# Patient Record
Sex: Female | Born: 1975
Health system: Southern US, Community
[De-identification: ages and names within clinical notes are randomized; demographics above are authoritative.]

## PROBLEM LIST (undated history)

## (undated) DIAGNOSIS — E7800 Pure hypercholesterolemia, unspecified: Secondary | ICD-10-CM

## (undated) DIAGNOSIS — M5430 Sciatica, unspecified side: Secondary | ICD-10-CM

## (undated) DIAGNOSIS — I10 Essential (primary) hypertension: Secondary | ICD-10-CM

## (undated) HISTORY — PX: HAND SURGERY: SHX662

## (undated) HISTORY — PX: BACK SURGERY: SHX140

## (undated) HISTORY — PX: CERVICAL FUSION: SHX112

---

## 2016-06-20 ENCOUNTER — Other Ambulatory Visit: Payer: Self-pay | Admitting: Internal Medicine

## 2016-06-20 DIAGNOSIS — M5136 Other intervertebral disc degeneration, lumbar region: Secondary | ICD-10-CM

## 2016-06-20 DIAGNOSIS — M5126 Other intervertebral disc displacement, lumbar region: Secondary | ICD-10-CM

## 2016-07-01 ENCOUNTER — Inpatient Hospital Stay: Admission: RE | Admit: 2016-07-01 | Payer: Self-pay | Source: Ambulatory Visit

## 2016-07-12 ENCOUNTER — Ambulatory Visit
Admission: RE | Admit: 2016-07-12 | Discharge: 2016-07-12 | Disposition: A | Discharge status: Home / Self Care | Payer: Managed Care, Other (non HMO) | Source: Ambulatory Visit | Attending: Internal Medicine | Admitting: Internal Medicine

## 2016-07-12 DIAGNOSIS — M5126 Other intervertebral disc displacement, lumbar region: Secondary | ICD-10-CM

## 2016-07-12 DIAGNOSIS — M5136 Other intervertebral disc degeneration, lumbar region: Secondary | ICD-10-CM

## 2016-08-04 ENCOUNTER — Other Ambulatory Visit: Payer: Self-pay | Admitting: Neurosurgery

## 2016-08-04 DIAGNOSIS — M5416 Radiculopathy, lumbar region: Secondary | ICD-10-CM

## 2016-08-13 ENCOUNTER — Ambulatory Visit
Admission: RE | Admit: 2016-08-13 | Discharge: 2016-08-13 | Disposition: A | Discharge status: Home / Self Care | Payer: Managed Care, Other (non HMO) | Source: Ambulatory Visit | Attending: Neurosurgery | Admitting: Neurosurgery

## 2016-08-13 DIAGNOSIS — M5416 Radiculopathy, lumbar region: Secondary | ICD-10-CM

## 2016-08-13 MED ORDER — METHYLPREDNISOLONE ACETATE 40 MG/ML INJ SUSP (RADIOLOG
120.0000 mg | Freq: Once | INTRAMUSCULAR | Status: AC
  Administered 2016-08-13: 120 mg via EPIDURAL

## 2016-08-13 MED ORDER — IOPAMIDOL (ISOVUE-M 200) INJECTION 41%
1.0000 mL | Freq: Once | INTRAMUSCULAR | Status: AC
  Administered 2016-08-13: 1 mL via EPIDURAL

## 2016-08-13 NOTE — Discharge Instructions (Signed)

## 2016-11-13 ENCOUNTER — Other Ambulatory Visit: Payer: Self-pay | Admitting: Neurosurgery

## 2016-11-13 DIAGNOSIS — M5116 Intervertebral disc disorders with radiculopathy, lumbar region: Secondary | ICD-10-CM

## 2016-11-17 ENCOUNTER — Ambulatory Visit
Admission: RE | Admit: 2016-11-17 | Discharge: 2016-11-17 | Disposition: A | Discharge status: Home / Self Care | Payer: Managed Care, Other (non HMO) | Source: Ambulatory Visit | Attending: Neurosurgery | Admitting: Neurosurgery

## 2016-11-17 ENCOUNTER — Other Ambulatory Visit: Payer: Self-pay | Admitting: Neurosurgery

## 2016-11-17 DIAGNOSIS — M5116 Intervertebral disc disorders with radiculopathy, lumbar region: Secondary | ICD-10-CM

## 2016-11-17 MED ORDER — IOPAMIDOL (ISOVUE-M 200) INJECTION 41%
1.0000 mL | Freq: Once | INTRAMUSCULAR | Status: AC
  Administered 2016-11-17: 1 mL via EPIDURAL

## 2016-11-17 MED ORDER — METHYLPREDNISOLONE ACETATE 40 MG/ML INJ SUSP (RADIOLOG
120.0000 mg | Freq: Once | INTRAMUSCULAR | Status: AC
  Administered 2016-11-17: 120 mg via EPIDURAL

## 2016-11-17 NOTE — Discharge Instructions (Signed)

## 2017-03-12 ENCOUNTER — Ambulatory Visit: Payer: 59 | Attending: Neurosurgery | Admitting: Physical Therapy

## 2017-03-24 ENCOUNTER — Ambulatory Visit: Payer: 59 | Attending: Neurosurgery | Admitting: Physical Therapy

## 2017-03-24 ENCOUNTER — Telehealth: Payer: Self-pay | Admitting: Physical Therapy

## 2017-03-24 NOTE — Telephone Encounter (Signed)
No show for PT eval on 03/12/17 & 03/24/17

## 2019-01-24 ENCOUNTER — Encounter (HOSPITAL_BASED_OUTPATIENT_CLINIC_OR_DEPARTMENT_OTHER): Payer: Self-pay | Admitting: Emergency Medicine

## 2019-01-24 ENCOUNTER — Other Ambulatory Visit: Payer: Self-pay

## 2019-01-24 ENCOUNTER — Emergency Department (HOSPITAL_BASED_OUTPATIENT_CLINIC_OR_DEPARTMENT_OTHER)
Admission: EM | Admit: 2019-01-24 | Discharge: 2019-01-24 | Disposition: A | Discharge status: Home / Self Care | Payer: BLUE CROSS/BLUE SHIELD | Attending: Emergency Medicine | Admitting: Emergency Medicine

## 2019-01-24 DIAGNOSIS — M25551 Pain in right hip: Secondary | ICD-10-CM | POA: Diagnosis not present

## 2019-01-24 DIAGNOSIS — M545 Low back pain: Secondary | ICD-10-CM | POA: Diagnosis present

## 2019-01-24 MED ORDER — HYDROCODONE-ACETAMINOPHEN 5-325 MG PO TABS
1.0000 | ORAL_TABLET | Freq: Once | ORAL | Status: AC
  Administered 2019-01-24: 12:00:00 1 via ORAL
  Filled 2019-01-24: qty 1

## 2019-01-24 MED ORDER — KETOROLAC TROMETHAMINE 15 MG/ML IJ SOLN
15.0000 mg | Freq: Once | INTRAMUSCULAR | Status: AC
  Administered 2019-01-24: 15 mg via INTRAMUSCULAR
  Filled 2019-01-24: qty 1

## 2019-01-24 MED ORDER — PREDNISONE 20 MG PO TABS: ORAL_TABLET | ORAL | 0 refills | Status: DC

## 2019-01-24 MED ORDER — METHOCARBAMOL 500 MG PO TABS: 1000.0000 mg | ORAL_TABLET | Freq: Four times a day (QID) | ORAL | 0 refills | Status: DC

## 2019-01-24 MED FILL — METHOCARBAMOL 500 MG TABLET: 500 | 3 days supply | Qty: 20 | Fill #0

## 2019-01-24 MED FILL — predniSONE 20 MG TABS: 20 | 12 days supply | Qty: 20 | Fill #0

## 2019-01-24 NOTE — ED Provider Notes (Signed)
[facility] EMERGENCY DEPARTMENT Provider Note   CSN: [id number] Arrival date & time: 01/24/19  1148    History   Chief Complaint Chief Complaint  Patient presents with  . Back Pain    HPI Victoria Hicks is a 43 y.o. female.     Patient presents the emergency department with complaint of right-sided back and hip pain ongoing over the past 2 days.  Patient has a history of lumbar surgeries for radiculopathy.  Last surgery was 2 years ago.  Last MRI approximately 1 year ago showed a bulging disc.  Patient is on twice a day Vicodin 5mg /325mg  currently.  Pain does not radiate into her leg as she has had in the past but the pain is in the right back, hip, and buttock area.  Pain is worse with movement.  She describes it as sharp in nature.  It was worse this morning prompting emergency department visit.  Patient has been taking Tylenol at home without improvement.  She has had steroid injections in her back in the past.  Patient denies warning symptoms of back pain including: fecal incontinence, urinary retention or overflow incontinence, night sweats, waking from sleep with back pain, unexplained fevers or weight loss, h/o cancer, IVDU, recent trauma.        No past medical history on file.  There are no active problems to display for this patient.     OB History   No obstetric history on file.      Home Medications    Prior to Admission medications   Medication Sig Start Date End Date Taking? Authorizing Provider  methocarbamol (ROBAXIN) 500 MG tablet Take 2 tablets (1,000 mg total) by mouth 4 (four) times daily. 01/24/19   Renne Crigler, PA-C  predniSONE (DELTASONE) 20 MG tablet 3 Tabs PO Days 1-3, then 2 tabs PO Days 4-6, then 1 tab PO Day 7-9, then Half Tab PO Day 10-12 01/24/19   Renne Crigler, PA-C    Family History No family history on file.  Social History Social History   Tobacco Use  . Smoking status: Never Smoker  . Smokeless tobacco: Never Used   Substance Use Topics  . Alcohol use: Yes    Frequency: Never  . Drug use: Never     Allergies   Peanut oil and Almond oil   Review of Systems Review of Systems  Constitutional: Negative for fever and unexpected weight change.  Gastrointestinal: Negative for constipation.       Negative for fecal incontinence.   Genitourinary: Negative for dysuria, flank pain, hematuria, pelvic pain, vaginal bleeding and vaginal discharge.       Negative for urinary incontinence or retention.  Musculoskeletal: Positive for back pain and myalgias.  Neurological: Negative for weakness and numbness.       Denies saddle paresthesias.     Physical Exam Updated Vital Signs BP (!) 141/96 (BP Location: Right Arm)   Pulse 92   Temp 99.2 F (37.3 C) (Oral)   Resp 20   Ht 5\' 1"  (1.549 m)   Wt 68 kg   SpO2 100%   BMI 28.34 kg/m   Physical Exam Vitals signs and nursing note reviewed.  Constitutional:      Appearance: She is well-developed.  HENT:     Head: Normocephalic and atraumatic.  Eyes:     Conjunctiva/sclera: Conjunctivae normal.  Neck:     Musculoskeletal: Normal range of motion and neck supple.  Pulmonary:     Effort: Pulmonary effort is  normal.  Abdominal:     Palpations: Abdomen is soft.     Tenderness: There is no abdominal tenderness.  Musculoskeletal:     Cervical back: She exhibits normal range of motion, no tenderness and no bony tenderness.     Thoracic back: She exhibits normal range of motion, no tenderness and no bony tenderness.     Lumbar back: She exhibits tenderness. She exhibits normal range of motion and no bony tenderness.       Back:     Comments: No step-off noted with palpation of spine.   Skin:    General: Skin is warm and dry.     Findings: No rash.  Neurological:     Mental Status: She is alert.     Sensory: No sensory deficit.     Deep Tendon Reflexes: Reflexes are normal and symmetric.     Comments: 5/5 strength in entire lower extremities  bilaterally. No sensation deficit.  Patient is able to slowly sit from a lying position and stand up from the side of the bed.  She has some discomfort with movement.  She is able to push up on her toes.      ED Treatments / Results  Labs (all labs ordered are listed, but only abnormal results are displayed) Labs Reviewed - No data to display  EKG None  Radiology No results found.  Procedures Procedures (including critical care time)  Medications Ordered in ED Medications  ketorolac (TORADOL) 15 MG/ML injection 15 mg (has no administration in time range)  HYDROcodone-acetaminophen (NORCO/VICODIN) 5-325 MG per tablet 1 tablet (has no administration in time range)     Initial Impression / Assessment and Plan / ED Course  I have reviewed the triage vital signs and the nursing notes.  Pertinent labs & imaging results that were available during my care of the patient were reviewed by me and considered in my medical decision making (see chart for details).        12:25 PM Patient seen and examined. Work-up initiated. Medications ordered.   Vital signs reviewed and are as follows: Vitals:   01/24/19 1201  BP: (!) 141/96  Pulse: 92  Resp: 20  Temp: 99.2 F (37.3 C)  SpO2: 100%    No red flag s/s of low back pain. Patient was counseled on back pain precautions and told to do activity as tolerated but do not lift, push, or pull heavy objects more than 10 pounds for the next week.  Patient counseled to use ice or heat on back for no longer than 15 minutes every hour.   Patient counseled on proper use of muscle relaxant medication.  They were told not to drink alcohol, drive any vehicle, or do any dangerous activities while taking this medication.  Patient verbalized understanding.  Patient urged to follow-up with PCP if pain does not improve with treatment and rest or if pain becomes recurrent. Urged to return with worsening severe pain, loss of bowel or bladder control,  trouble walking.   The patient verbalizes understanding and agrees with the plan.    Final Clinical Impressions(s) / ED Diagnoses   Final diagnoses:  Right hip pain   Patient with back and hip pain. No neurological deficits today.  Pain is readily reproducible and is musculoskeletal in nature.  Cannot entirely rule out radicular pain, however pain is different than prior radicular pain.  No red flags.  Patient is ambulatory. No warning symptoms of back pain including: fecal incontinence, urinary retention  or overflow incontinence, night sweats, waking from sleep with back pain, unexplained fevers or weight loss, h/o cancer, IVDU, recent trauma. No concern for cauda equina, epidural abscess, or other serious cause of back pain. Conservative measures such as rest, ice/heat and pain medicine indicated with PCP follow-up if no improvement with conservative management.    ED Discharge Orders         Ordered    predniSONE (DELTASONE) 20 MG tablet     01/24/19 1219    methocarbamol (ROBAXIN) 500 MG tablet  4 times daily     01/24/19 1219           Renne Crigler, PA-C 01/24/19 1226    Pricilla Loveless, MD 01/24/19 1251

## 2019-01-24 NOTE — Discharge Instructions (Signed)
Please read and follow all provided instructions.  Your diagnoses today include:  1. Right hip pain     Tests performed today include:  Vital signs - see below for your results today  Medications prescribed:   Prednisone - steroid medicine   It is best to take this medication in the morning to prevent sleeping problems. If you are diabetic, monitor your blood sugar closely and stop taking Prednisone if blood sugar is over 300. Take with food to prevent stomach upset.    Robaxin (methocarbamol) - muscle relaxer medication  DO NOT drive or perform any activities that require you to be awake and alert because this medicine can make you drowsy.   Take any prescribed medications only as directed.  Home care instructions:   Follow any educational materials contained in this packet  Please rest, use ice or heat on your back for the next several days  Do not lift, push, pull anything more than 10 pounds for the next week  Follow-up instructions: Please follow-up with your primary care provider in the next 1 week for further evaluation of your symptoms.   Return instructions:  SEEK IMMEDIATE MEDICAL ATTENTION IF YOU HAVE:  New numbness, tingling, weakness, or problem with the use of your arms or legs  Severe back pain not relieved with medications  Loss control of your bowels or bladder  Increasing pain in any areas of the body (such as chest or abdominal pain)  Shortness of breath, dizziness, or fainting.   Worsening nausea (feeling sick to your stomach), vomiting, fever, or sweats  Any other emergent concerns regarding your health   Additional Information:  Your vital signs today were: BP (!) 141/96 (BP Location: Right Arm)    Pulse 92    Temp 99.2 F (37.3 C) (Oral)    Resp 20    Ht 5\' 1"  (1.549 m)    Wt 68 kg    SpO2 100%    BMI 28.34 kg/m  If your blood pressure (BP) was elevated above 135/85 this visit, please have this repeated by your doctor within one  month. --------------

## 2019-01-24 NOTE — ED Triage Notes (Signed)
Reports right sided back and hip pain x 2 days.  Denies injury.  Reports previous back surgeries.

## 2019-04-29 ENCOUNTER — Encounter (HOSPITAL_BASED_OUTPATIENT_CLINIC_OR_DEPARTMENT_OTHER): Payer: Self-pay | Admitting: *Deleted

## 2019-04-29 ENCOUNTER — Other Ambulatory Visit: Payer: Self-pay

## 2019-04-29 ENCOUNTER — Emergency Department (HOSPITAL_BASED_OUTPATIENT_CLINIC_OR_DEPARTMENT_OTHER)
Admission: EM | Admit: 2019-04-29 | Discharge: 2019-04-29 | Disposition: A | Discharge status: Home / Self Care | Payer: BC Managed Care – PPO | Attending: Emergency Medicine | Admitting: Emergency Medicine

## 2019-04-29 DIAGNOSIS — Z5321 Procedure and treatment not carried out due to patient leaving prior to being seen by health care provider: Secondary | ICD-10-CM | POA: Diagnosis not present

## 2019-04-29 DIAGNOSIS — M79601 Pain in right arm: Secondary | ICD-10-CM | POA: Diagnosis present

## 2019-04-29 HISTORY — DX: Pure hypercholesterolemia, unspecified: E78.00

## 2019-04-29 HISTORY — DX: Essential (primary) hypertension: I10

## 2019-04-29 NOTE — ED Triage Notes (Signed)
For a week she has had pain from her right elbow to her hand. Painful to grip.

## 2019-04-29 NOTE — ED Notes (Signed)
Pt left without notifying staff.  

## 2019-09-21 ENCOUNTER — Emergency Department (HOSPITAL_BASED_OUTPATIENT_CLINIC_OR_DEPARTMENT_OTHER)
Admission: EM | Admit: 2019-09-21 | Discharge: 2019-09-21 | Disposition: A | Discharge status: Home / Self Care | Payer: BC Managed Care – PPO | Attending: Emergency Medicine | Admitting: Emergency Medicine

## 2019-09-21 ENCOUNTER — Encounter (HOSPITAL_BASED_OUTPATIENT_CLINIC_OR_DEPARTMENT_OTHER): Payer: Self-pay | Admitting: *Deleted

## 2019-09-21 ENCOUNTER — Other Ambulatory Visit: Payer: Self-pay

## 2019-09-21 DIAGNOSIS — I10 Essential (primary) hypertension: Secondary | ICD-10-CM | POA: Diagnosis not present

## 2019-09-21 DIAGNOSIS — G8929 Other chronic pain: Secondary | ICD-10-CM | POA: Diagnosis not present

## 2019-09-21 DIAGNOSIS — M79605 Pain in left leg: Secondary | ICD-10-CM | POA: Diagnosis present

## 2019-09-21 DIAGNOSIS — M5442 Lumbago with sciatica, left side: Secondary | ICD-10-CM | POA: Insufficient documentation

## 2019-09-21 DIAGNOSIS — Z79899 Other long term (current) drug therapy: Secondary | ICD-10-CM | POA: Diagnosis not present

## 2019-09-21 DIAGNOSIS — E785 Hyperlipidemia, unspecified: Secondary | ICD-10-CM | POA: Diagnosis not present

## 2019-09-21 MED ORDER — PREDNISONE 20 MG PO TABS: 20.0000 mg | ORAL_TABLET | Freq: Every day | ORAL | 0 refills | Status: AC

## 2019-09-21 MED ORDER — HYDROMORPHONE HCL 1 MG/ML IJ SOLN: 1.0000 mg | Freq: Once | INTRAMUSCULAR | Status: DC

## 2019-09-21 MED ORDER — HYDROMORPHONE HCL 1 MG/ML IJ SOLN
1.0000 mg | Freq: Once | INTRAMUSCULAR | Status: AC
  Administered 2019-09-21: 1 mg via INTRAMUSCULAR
  Filled 2019-09-21: qty 1

## 2019-09-21 MED FILL — predniSONE 20 MG TABS: 20 | 5 days supply | Qty: 5 | Fill #0

## 2019-09-21 NOTE — Discharge Instructions (Signed)
I am sending you home with 5 days worth of steroids that will help with inflammation. Take as prescribed and take in the morning. I recommend calling your doctor's office tomorrow morning to schedule an appointment. You will most likely need another MRI. Continue to take your pain medication as prescribed. Return to the ER for new or worsening symptoms.

## 2019-09-21 NOTE — ED Triage Notes (Signed)
Pt c/o left leg pain x 3 weeks . seen at pain management with stimulator being placed 12/22

## 2019-09-21 NOTE — ED Provider Notes (Signed)
[facility] EMERGENCY DEPARTMENT Provider Note   CSN: [id number] Arrival date & time: 09/21/19  1633     History   Chief Complaint Chief Complaint  Patient presents with  . Pain    chronic     HPI Victoria Hicks is a 43 y.o. female with a past medical history significant for hyperlipidemia and hypertension who presents to the ED due to worsening posterior left leg pain that radiates into left buttocks region for the past 2 years. Patient has a history of low back pain with left-sided sciatica for numerous years. Patient is having a spinal stimulator placed on 12/22, but she notes her pain is unmanageable at home. She sees Dr. Posey Pronto with pain management and is prescribed hydrocodone and gabapentin. Patient has a history of 2 spinal surgeries with the last being 2 years ago. Pain is worse with movement and ambulation. Patient described the pain as sharp and achy in nature with associative numbness of left foot. Patient denies red flags such as bowel/bladder incontinence, fever/chills, weight loss, hx. Cancer, trauma, and saddle paresthesia. Patient denies IVDU.     Past Medical History:  Diagnosis Date  . High cholesterol   . Hypertension     There are no active problems to display for this patient.   Past Surgical History:  Procedure Laterality Date  . BACK SURGERY       OB History   No obstetric history on file.      Home Medications    Prior to Admission medications   Medication Sig Start Date End Date Taking? Authorizing Provider  atenolol (TENORMIN) 50 MG tablet TK 1 T PO D 12/24/18   [provider]  atorvastatin (LIPITOR) 20 MG tablet Take by mouth. 01/31/19   [provider]  gabapentin (NEURONTIN) 600 MG tablet Take by mouth. 03/02/19   [provider]  HYDROcodone-acetaminophen ([place]) 7.5-325 MG tablet Take by mouth. 04/16/19   [provider]  Liraglutide -Weight Management (SAXENDA) 18 MG/3ML SOPN Inject into the  skin. 03/22/18   [provider]  methocarbamol (ROBAXIN) 500 MG tablet Take 2 tablets (1,000 mg total) by mouth 4 (four) times daily. 01/24/19   Carlisle Cater, PA-C  predniSONE (DELTASONE) 20 MG tablet 3 Tabs PO Days 1-3, then 2 tabs PO Days 4-6, then 1 tab PO Day 7-9, then Half Tab PO Day 10-12 01/24/19   Carlisle Cater, PA-C  predniSONE (DELTASONE) 20 MG tablet Take 1 tablet (20 mg total) by mouth daily for 5 days. 09/21/19 09/26/19  Jonette Eva, PA-C    Family History History reviewed. No pertinent family history.  Social History Social History   Tobacco Use  . Smoking status: Never Smoker  . Smokeless tobacco: Never Used  Substance Use Topics  . Alcohol use: Yes    Frequency: Never  . Drug use: Never     Allergies   Peanut oil and Almond oil   Review of Systems Review of Systems  Constitutional: Negative for chills and fever.  Gastrointestinal: Negative for abdominal pain, diarrhea, nausea and vomiting.  Genitourinary: Negative for difficulty urinating, dyspareunia and dysuria.  Musculoskeletal: Positive for back pain, gait problem and myalgias.  Skin: Negative for color change and wound.  Neurological: Positive for numbness. Negative for weakness.     Physical Exam Updated Vital Signs BP (!) 147/102 (BP Location: Left Arm)   Pulse (!) 110   Temp 98.9 F (37.2 C) (Oral)   Resp 20   LMP 09/17/2019  SpO2 96%   Physical Exam Vitals signs and nursing note reviewed.  Constitutional:      General: She is not in acute distress.    Appearance: She is not ill-appearing.     Comments: Appears very uncomfortable in bed. Tearful  HENT:     Head: Normocephalic.  Eyes:     Conjunctiva/sclera: Conjunctivae normal.  Neck:     Musculoskeletal: Neck supple.  Cardiovascular:     Rate and Rhythm: Regular rhythm. Tachycardia present.     Pulses: Normal pulses.     Heart sounds: Normal heart sounds. No murmur. No friction rub. No gallop.   Pulmonary:      Effort: Pulmonary effort is normal.     Breath sounds: Normal breath sounds.  Abdominal:     General: Abdomen is flat. There is no distension.     Palpations: Abdomen is soft.     Tenderness: There is no abdominal tenderness. There is no guarding or rebound.  Musculoskeletal:     Comments: No T-spine and L-spine midline tenderness, no stepoff or deformity, reproducible left-sided lumbar paraspinal tenderness. Tenderness to palpation over posterior left leg from left buttocks to left calf. No edema, erythema, or warmth. No leg edema bilaterally Patient moves all extremities without difficulty. DP/PT pulses 2+ and equal bilaterally Sensation grossly intact bilaterally Strength of knee flexion and extension is 5/5 Plantar and dorsiflexion of ankle 5/5 Achilles and patellar reflexes present and equal Able to ambulate without difficulty   Skin:    General: Skin is warm and dry.  Neurological:     General: No focal deficit present.     Mental Status: She is alert.      ED Treatments / Results  Labs (all labs ordered are listed, but only abnormal results are displayed) Labs Reviewed - No data to display  EKG None  Radiology No results found.  Procedures Procedures (including critical care time)  Medications Ordered in ED Medications  HYDROmorphone (DILAUDID) injection 1 mg (1 mg Intramuscular Given 09/21/19 1728)     Initial Impression / Assessment and Plan / ED Course  I have reviewed the triage vital signs and the nursing notes.  Pertinent labs & imaging results that were available during my care of the patient were reviewed by me and considered in my medical decision making (see chart for details).       43 year old female presents to the ED with chronic left buttock pain radiating into left leg. Patient is afebrile, not hypoxic, and non-ill appearing. Patient in no acute distress, but appears to be in pain during exam. No midline thoracic or lumbar tenderness.  Tenderness to palpation in left lumbar paraspinal region with left posterior leg tenderness. Neurovascularly intact. Able to ambulate in the ED. Patient without low back red flags such as urinary/bowel incontinence, lower extremity weakness, saddle paresthesia, IVDU, and history of cancer. No concern for cauda equina, epidural abscess, or other serious causes of back pain with sciatica. Pain treated in ED. Will discharge patient with short course of steroids. Conservative measures of rest, ice/heat, and pain medication discussed with patient. Patient advised to call her surgeon tomorrow morning for further evaluation of back pain. Strict ED precautions discussed with patient. Patient states understanding and agrees to plan. Patient discharged home in no acute distress and stable vitals   Final Clinical Impressions(s) / ED Diagnoses   Final diagnoses:  Chronic left-sided low back pain with left-sided sciatica    ED Discharge Orders  Ordered    predniSONE (DELTASONE) 20 MG tablet  Daily     09/21/19 1745           Lorelle FormosaCheek, Caroline B, PA-C 09/21/19 2039    Maia PlanLong, Joshua G, MD 09/24/19 1306

## 2020-01-18 ENCOUNTER — Other Ambulatory Visit: Payer: Self-pay

## 2020-01-18 ENCOUNTER — Encounter (HOSPITAL_BASED_OUTPATIENT_CLINIC_OR_DEPARTMENT_OTHER): Payer: Self-pay | Admitting: Emergency Medicine

## 2020-01-18 ENCOUNTER — Emergency Department (HOSPITAL_BASED_OUTPATIENT_CLINIC_OR_DEPARTMENT_OTHER)
Admission: EM | Admit: 2020-01-18 | Discharge: 2020-01-18 | Disposition: A | Discharge status: Home / Self Care | Payer: BC Managed Care – PPO | Attending: Emergency Medicine | Admitting: Emergency Medicine

## 2020-01-18 ENCOUNTER — Emergency Department (HOSPITAL_BASED_OUTPATIENT_CLINIC_OR_DEPARTMENT_OTHER): Payer: BC Managed Care – PPO

## 2020-01-18 DIAGNOSIS — Z79899 Other long term (current) drug therapy: Secondary | ICD-10-CM | POA: Diagnosis not present

## 2020-01-18 DIAGNOSIS — S93402A Sprain of unspecified ligament of left ankle, initial encounter: Secondary | ICD-10-CM | POA: Insufficient documentation

## 2020-01-18 DIAGNOSIS — W109XXA Fall (on) (from) unspecified stairs and steps, initial encounter: Secondary | ICD-10-CM | POA: Insufficient documentation

## 2020-01-18 DIAGNOSIS — S99912A Unspecified injury of left ankle, initial encounter: Secondary | ICD-10-CM | POA: Diagnosis present

## 2020-01-18 DIAGNOSIS — Y929 Unspecified place or not applicable: Secondary | ICD-10-CM | POA: Insufficient documentation

## 2020-01-18 DIAGNOSIS — I10 Essential (primary) hypertension: Secondary | ICD-10-CM | POA: Diagnosis not present

## 2020-01-18 DIAGNOSIS — Y999 Unspecified external cause status: Secondary | ICD-10-CM | POA: Diagnosis not present

## 2020-01-18 DIAGNOSIS — Y939 Activity, unspecified: Secondary | ICD-10-CM | POA: Diagnosis not present

## 2020-01-18 MED ORDER — NAPROXEN 250 MG PO TABS
500.0000 mg | ORAL_TABLET | Freq: Once | ORAL | Status: AC
  Administered 2020-01-18: 500 mg via ORAL
  Filled 2020-01-18: qty 2

## 2020-01-18 MED ORDER — NAPROXEN 375 MG PO TABS: 375.0000 mg | ORAL_TABLET | Freq: Two times a day (BID) | ORAL | 0 refills | Status: AC

## 2020-01-18 MED FILL — NAPROXEN 375 MG TABLET: 375 | 7 days supply | Qty: 14 | Fill #0

## 2020-01-18 NOTE — Discharge Instructions (Signed)
Keep your left ankle elevated, you may apply ICE to the area.   Please keep your aircast on for the next 5 days to help with the swelling. I have also prescribed NSAIDS to help with your left ankle swelling.  The number to Sport medicine specialist is attached to your chart, if your symptoms do not improve make an appointment for follow up.

## 2020-01-18 NOTE — ED Provider Notes (Signed)
[facility] EMERGENCY DEPARTMENT Provider Note   CSN: [id number] Arrival date & time: 01/18/20  1543     History Chief Complaint  Patient presents with  . Ankle Injury    Victoria Hicks is a 44 y.o. female.  44 y.o female with a PMH of HTN, High cholesterol presents to the ED with a chief complaint of left ankle pain x today. Patient reports walking down steps today when she missed a step, states left foot inverted. She has pain along the dorsum aspect of her foot and middle malleolus this is exacerbated with ambulation. She has not taken any medication for improvement in symptoms. No other injuries.   The history is provided by the patient.       Past Medical History:  Diagnosis Date  . High cholesterol   . Hypertension     There are no problems to display for this patient.   Past Surgical History:  Procedure Laterality Date  . BACK SURGERY       OB History   No obstetric history on file.     History reviewed. No pertinent family history.  Social History   Tobacco Use  . Smoking status: Never Smoker  . Smokeless tobacco: Never Used  Substance Use Topics  . Alcohol use: Yes  . Drug use: Never    Home Medications Prior to Admission medications   Medication Sig Start Date End Date Taking? Authorizing Provider  atenolol (TENORMIN) 50 MG tablet TK 1 T PO D 12/24/18   [provider]  atorvastatin (LIPITOR) 20 MG tablet Take by mouth. 01/31/19   [provider]  gabapentin (NEURONTIN) 600 MG tablet Take by mouth. 03/02/19   [provider]  HYDROcodone-acetaminophen (NORCO) 7.5-325 MG tablet Take by mouth. 04/16/19   [provider]  Liraglutide -Weight Management (SAXENDA) 18 MG/3ML SOPN Inject into the skin. 03/22/18   [provider]  naproxen (NAPROSYN) 375 MG tablet Take 1 tablet (375 mg total) by mouth 2 (two) times daily for 7 days. 01/18/20 01/25/20  Claude Manges, PA-C    Allergies    Peanut oil and  Almond oil  Review of Systems   Review of Systems  Constitutional: Negative for fever.  Musculoskeletal: Positive for arthralgias.    Physical Exam Updated Vital Signs BP (!) 154/96 (BP Location: Right Arm)   Pulse 93   Temp 98.7 F (37.1 C) (Oral)   Resp 18   Ht 5\' 1"  (1.549 m)   Wt 72.6 kg   SpO2 100%   BMI 30.23 kg/m   Physical Exam Vitals and nursing note reviewed.  Constitutional:      Appearance: Normal appearance.  HENT:     Head: Normocephalic and atraumatic.     Nose: Nose normal.     Mouth/Throat:     Mouth: Mucous membranes are moist.  Eyes:     Pupils: Pupils are equal, round, and reactive to light.  Cardiovascular:     Rate and Rhythm: Normal rate.  Abdominal:     General: Abdomen is flat.  Musculoskeletal:     Cervical back: Normal range of motion and neck supple.     Left ankle: Swelling present. No deformity, ecchymosis or lacerations. Tenderness present. No lateral malleolus tenderness. Normal range of motion.     Comments: Pulses present, capillary refill is intact, significant swelling noted to ankle joint. Limited ROM due to pain.   Skin:    General: Skin is warm and dry.  Neurological:  Mental Status: She is alert and oriented to person, place, and time.     ED Results / Procedures / Treatments   Labs (all labs ordered are listed, but only abnormal results are displayed) Labs Reviewed - No data to display  EKG None  Radiology DG Ankle Complete Left  Result Date: 01/18/2020 CLINICAL DATA:  Ankle pain EXAM: LEFT ANKLE COMPLETE - 3+ VIEW COMPARISON:  None. FINDINGS: No fracture or malalignment. Diffuse soft tissue swelling. Ankle mortise is symmetric. IMPRESSION: Soft tissue swelling.  No acute osseous abnormality. Electronically Signed   By: Donavan Foil M.D.   On: 01/18/2020 16:25    Procedures Procedures (including critical care time)  Medications Ordered in ED Medications  naproxen (NAPROSYN) tablet 500 mg (500 mg Oral Given  01/18/20 1651)    ED Course  I have reviewed the triage vital signs and the nursing notes.  Pertinent labs & imaging results that were available during my care of the patient were reviewed by me and considered in my medical decision making (see chart for details).    MDM Rules/Calculators/A&P   Patient with no pertinent PMH resents to the ED status post left ankle injury.  She reports walking down the steps when she missed one of the steps, there is swelling noted to her left ankle, reports her foot likely inverted.  There is pain along the dorsum aspect of the foot where shoots up her leg, there is also significant swelling noted to the ankle joint.  She does have full range of motion with pain.  Neurovascularly intact, no obvious deformity was noted, no laceration or erythema noted.  DG ankle showed: Soft tissue swelling. No acute osseous abnormality.  These results were discussed at length with patient, [place] no reports a good amount of walking.  Patient for likely an ankle sprain, rice therapy was encouraged, anti-inflammatories will be prescribed for her for the next 7 days.  She will also be provided with crutches along with a work note.  Return precautions have been discussed at length.  She also has been provided with the sports medicine specialist follow-up number if her symptoms worsen.  Patient stable for discharge.   Portions of this note were generated with [employer]. Dictation errors may occur despite best attempts at proofreading.  Final Clinical Impression(s) / ED Diagnoses Final diagnoses:  Sprain of left ankle, unspecified ligament, initial encounter    Rx / DC Orders ED Discharge Orders         Ordered    naproxen (NAPROSYN) 375 MG tablet  2 times daily     01/18/20 1651           Janeece Fitting, PA-C 01/18/20 1657    Virgel Manifold, MD 01/18/20 Greer Ee

## 2020-01-18 NOTE — ED Triage Notes (Signed)
Pt here after missing a step and hearing her left ankle pop. On waking it was swollen and tender.

## 2021-03-18 ENCOUNTER — Other Ambulatory Visit: Payer: Self-pay

## 2021-03-18 ENCOUNTER — Encounter (HOSPITAL_BASED_OUTPATIENT_CLINIC_OR_DEPARTMENT_OTHER): Payer: Self-pay | Admitting: Emergency Medicine

## 2021-03-18 ENCOUNTER — Emergency Department (HOSPITAL_BASED_OUTPATIENT_CLINIC_OR_DEPARTMENT_OTHER)
Admission: EM | Admit: 2021-03-18 | Discharge: 2021-03-18 | Disposition: A | Discharge status: Home / Self Care | Payer: BC Managed Care – PPO | Attending: Emergency Medicine | Admitting: Emergency Medicine

## 2021-03-18 DIAGNOSIS — I10 Essential (primary) hypertension: Secondary | ICD-10-CM | POA: Diagnosis not present

## 2021-03-18 DIAGNOSIS — Z79899 Other long term (current) drug therapy: Secondary | ICD-10-CM | POA: Insufficient documentation

## 2021-03-18 DIAGNOSIS — M62830 Muscle spasm of back: Secondary | ICD-10-CM | POA: Diagnosis not present

## 2021-03-18 DIAGNOSIS — G8929 Other chronic pain: Secondary | ICD-10-CM | POA: Insufficient documentation

## 2021-03-18 DIAGNOSIS — M545 Low back pain, unspecified: Secondary | ICD-10-CM | POA: Diagnosis present

## 2021-03-18 DIAGNOSIS — Z91010 Allergy to peanuts: Secondary | ICD-10-CM | POA: Diagnosis not present

## 2021-03-18 MED ORDER — KETOROLAC TROMETHAMINE 30 MG/ML IJ SOLN
30.0000 mg | Freq: Once | INTRAMUSCULAR | Status: AC
  Administered 2021-03-18: 30 mg via INTRAVENOUS
  Filled 2021-03-18: qty 1

## 2021-03-18 MED ORDER — METHOCARBAMOL 1000 MG/10ML IJ SOLN
1000.0000 mg | Freq: Once | INTRAMUSCULAR | Status: AC
  Administered 2021-03-18: 1000 mg via INTRAVENOUS
  Filled 2021-03-18: qty 10

## 2021-03-18 MED ORDER — HYDROMORPHONE HCL 1 MG/ML IJ SOLN
1.0000 mg | Freq: Once | INTRAMUSCULAR | Status: AC
  Administered 2021-03-18: 1 mg via INTRAVENOUS
  Filled 2021-03-18: qty 1

## 2021-03-18 MED ORDER — DEXAMETHASONE SODIUM PHOSPHATE 10 MG/ML IJ SOLN
8.0000 mg | Freq: Once | INTRAMUSCULAR | Status: AC
  Administered 2021-03-18: 8 mg via INTRAVENOUS
  Filled 2021-03-18: qty 1

## 2021-03-18 NOTE — Discharge Instructions (Addendum)
Take your medications as directed. Follow closely outpatient regarding your visit today. Return to an ED for uncontrollable pain, new numbness/weakness in your extremities, bowel or bladder incontinence, or other concerning symptoms.

## 2021-03-18 NOTE — ED Provider Notes (Signed)
[facility] EMERGENCY DEPARTMENT Provider Note   CSN: [id number] Arrival date & time: 03/18/21  1805     History Chief Complaint  Patient presents with  . Back Pain     Victoria Hicks is a 45 y.o. female history of hypertension, chronic low back pain, presenting for evaluation of back pain. Patient reports chronic back pain, intermittently it will spasm up like this.  Today, she believes her back pain was triggered when she reached out to try to help somebody at work from falling.  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  She had gradually worsening low back pain since then.  When she got home she laid down to take a nap and when she woke up her back pain was worse.  She feels significant spasm to bilateral low back, pain is worse with any movement.  No numbness or weakness in her extremities.  She is prescribed Flexeril, Vicodin for her pain.  She has outpatient SI joint injection scheduled for tomorrow.  The history is provided by the patient.       Past Medical History:  Diagnosis Date  . High cholesterol   . Hypertension     There are no problems to display for this patient.   Past Surgical History:  Procedure Laterality Date  . BACK SURGERY       OB History   No obstetric history on file.     History reviewed. No pertinent family history.  Social History   Tobacco Use  . Smoking status: Never Smoker  . Smokeless tobacco: Never Used  Substance Use Topics  . Alcohol use: Yes    Comment: occ  . Drug use: Never    Home Medications Prior to Admission medications   Medication Sig Start Date End Date Taking? Authorizing Provider  atenolol (TENORMIN) 50 MG tablet TK 1 T PO D 12/24/18  Yes [provider]  atorvastatin (LIPITOR) 20 MG tablet Take by mouth. 01/31/19  Yes [provider]  cyclobenzaprine (FLEXERIL) 10 MG tablet Take 1 tablet by mouth 3 (three) times daily. 02/21/21  Yes [provider]  HYDROcodone-acetaminophen (NORCO) 7.5-325 MG tablet Take by mouth. 04/16/19  Yes [provider]  gabapentin (NEURONTIN) 600 MG tablet Take by mouth. 03/02/19   [provider]  Liraglutide -Weight Management (SAXENDA) 18 MG/3ML SOPN Inject into the skin. 03/22/18   [provider]    Allergies    Peanut oil and Almond oil  Review of Systems   Review of Systems  Musculoskeletal: Positive for back pain.  All other systems reviewed and are negative.   Physical Exam Updated Vital Signs BP (!) 127/92   Pulse 87   Temp 98.2 F (36.8 C) (Oral)   Resp 16   Ht 5\' 1"  (1.549 m)   Wt 75.8 kg   LMP 03/11/2021   SpO2 96%   BMI 31.55 kg/m   Physical Exam Vitals and nursing note reviewed.  Constitutional:      Appearance: She is well-developed.  HENT:     Head: Normocephalic and atraumatic.  Eyes:     Conjunctiva/sclera: Conjunctivae normal.  Cardiovascular:     Rate and Rhythm: Normal rate and regular rhythm.  Pulmonary:     Effort: Pulmonary effort is normal. No respiratory distress.     Breath sounds: Normal breath sounds.  Abdominal:     General: Bowel sounds are normal.     Palpations: Abdomen is soft.  Tenderness: There is no abdominal tenderness.  Musculoskeletal:     Comments: TTP to bilateral lower lumbar/sacral region.  No midline spinal tenderness.  No tenderness to superior portion of the lumbar paraspinal region.  Positive straight leg raise on the left.  Skin:    General: Skin is warm.  Neurological:     Mental Status: She is alert.     Comments: Normal tone.  5/5 strength in BLE including strong and equal dorsiflexion/plantar flexion Sensory:  light touch normal in BLE extremities.   Gait: deferred due to pain CV: distal pulses palpable throughout    Psychiatric:        Behavior: Behavior normal.     ED Results / Procedures / Treatments   Labs (all labs ordered are listed, but only abnormal results are displayed) Labs  Reviewed - No data to display  EKG None  Radiology No results found.  Procedures Procedures   Medications Ordered in ED Medications  HYDROmorphone (DILAUDID) injection 1 mg (1 mg Intravenous Given 03/18/21 1925)  methocarbamol (ROBAXIN) injection 1,000 mg (1,000 mg Intravenous Given 03/18/21 1925)  ketorolac (TORADOL) 30 MG/ML injection 30 mg (30 mg Intravenous Given 03/18/21 1926)  dexamethasone (DECADRON) injection 8 mg (8 mg Intravenous Given 03/18/21 1926)    ED Course  I have reviewed the triage vital signs and the nursing notes.  Pertinent labs & imaging results that were available during my care of the patient were reviewed by me and considered in my medical decision making (see chart for details).    MDM Rules/Calculators/A&P                          Patient with history of chronic back pain presenting with exacerbation.  She is having spasms in her low back.  No new neurologic symptoms or deficits.  No incontinence.  She is treated here with pain medication, muscle relaxer, NSAID and Decadron with adequate relief in symptoms.  She is now ambulatory in the ED and feels comfortable with discharge to home.  She is chronically prescribed hydrocodone and Flexeril.  She also has appointment outpatient tomorrow for SI joint injections.  Symptomatic management and return precautions discussed.  Discharge no distress  Discussed results, findings, treatment and follow up. Patient advised of return precautions. Patient verbalized understanding and agreed with plan.  Final Clinical Impression(s) / ED Diagnoses Final diagnoses:  None    Rx / DC Orders ED Discharge Orders    None       Bobbijo Holst, [place] N, PA-C 03/18/21 2148    Terald Sleeper, MD 03/19/21 [phone]

## 2021-03-18 NOTE — ED Notes (Signed)
Pt arrives via EMS, per EMS report pt c/o increased back pain, reports hx of back spasms. Was assisting a person to prevent a fall and twisted, causing back spasms. Pain not responding to RX meds

## 2021-03-18 NOTE — ED Notes (Addendum)
"  A passenger was falling, I return to try to catch her. I think my pain resulted from trying to assist the lady."

## 2021-03-18 NOTE — ED Triage Notes (Signed)
Pt arrives via EMS, reports lower back pain with spasms after attempting to prevent a person from falling this am. Endorses hx of back spasms. Took flexeril at 12pm today, no relief.

## 2021-10-27 IMAGING — CR DG ANKLE COMPLETE 3+V*L*
3 series · 3 of 3 positions shown · non-contrast
Comparison: None.

CLINICAL DATA: Ankle pain

EXAM:
LEFT ANKLE COMPLETE - 3+ VIEW

[t ankle joint ap left]
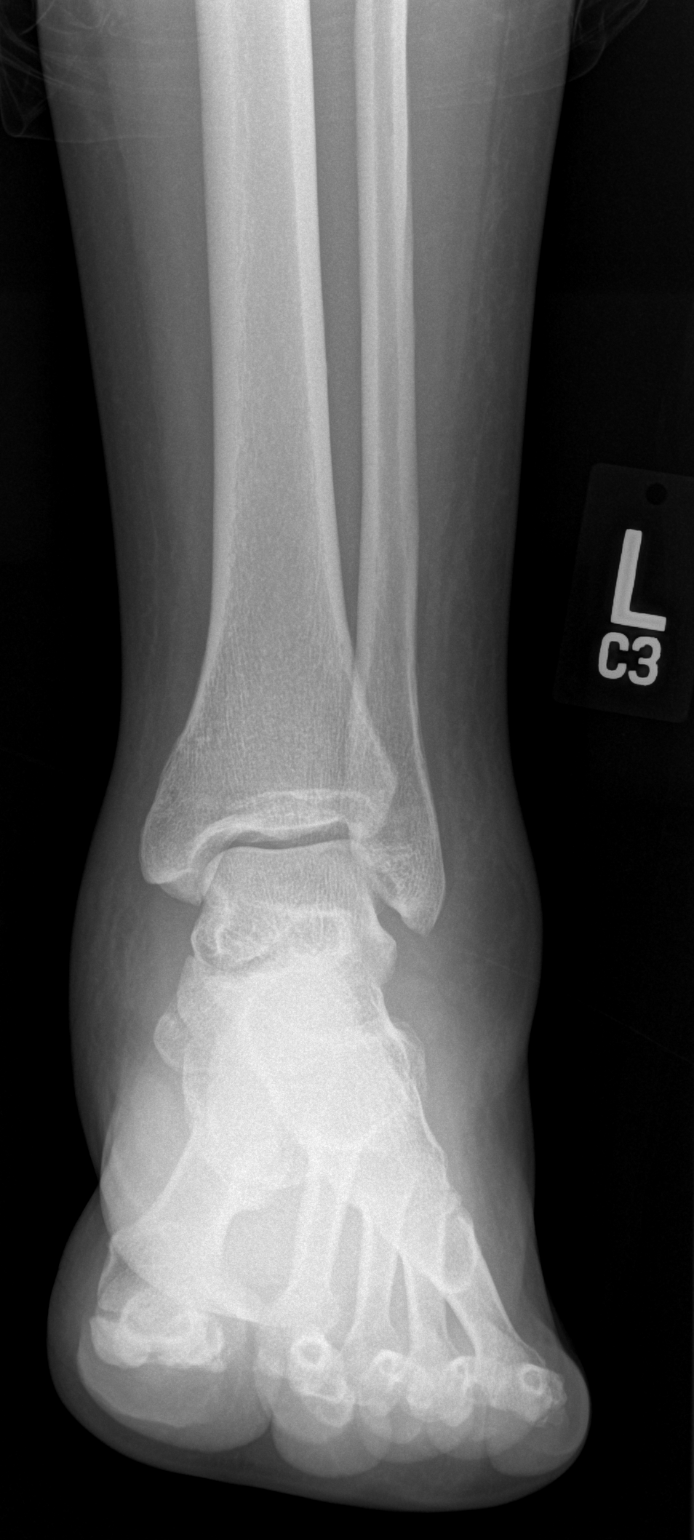

[t ankle joint oblique left]
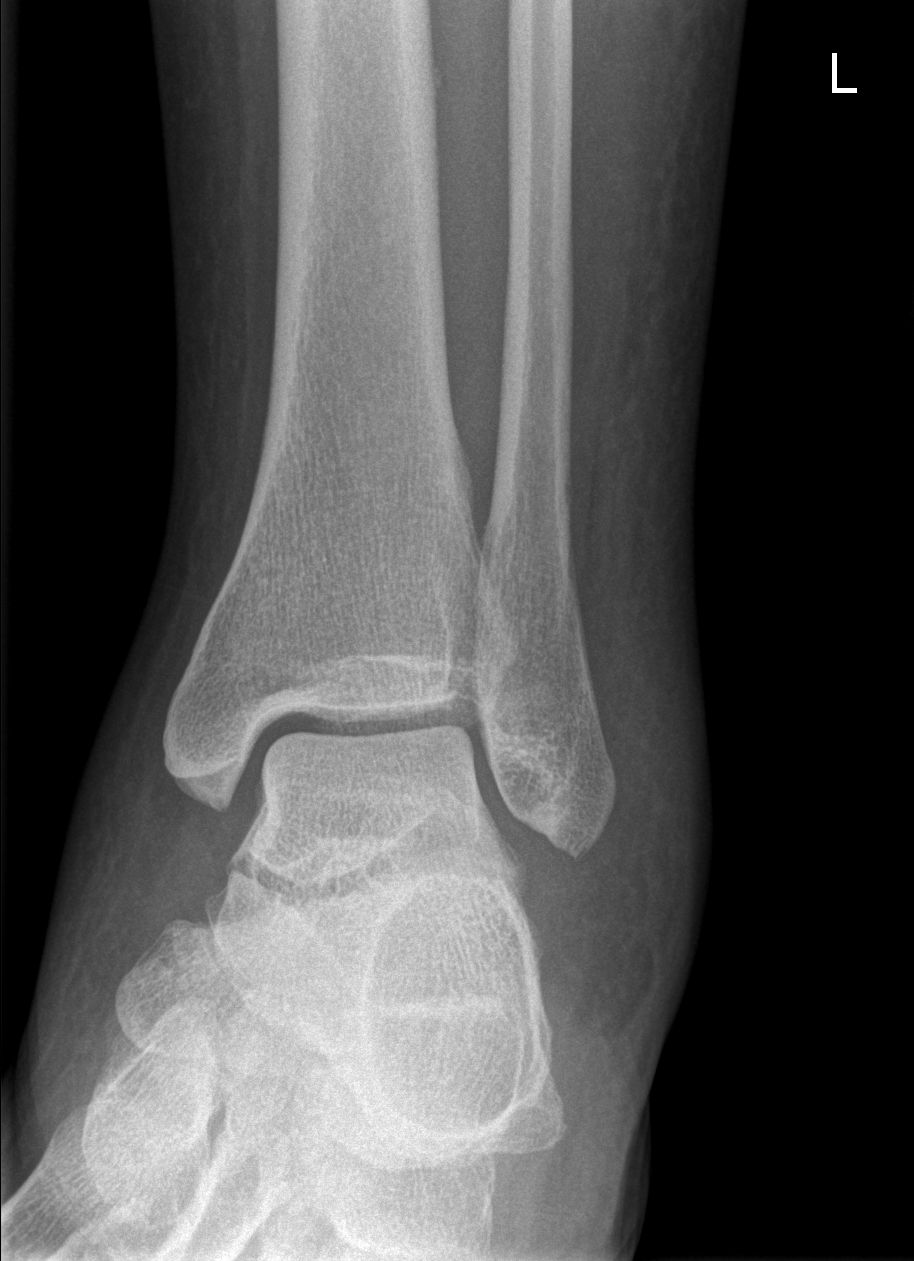

[t ankle joint lat left]
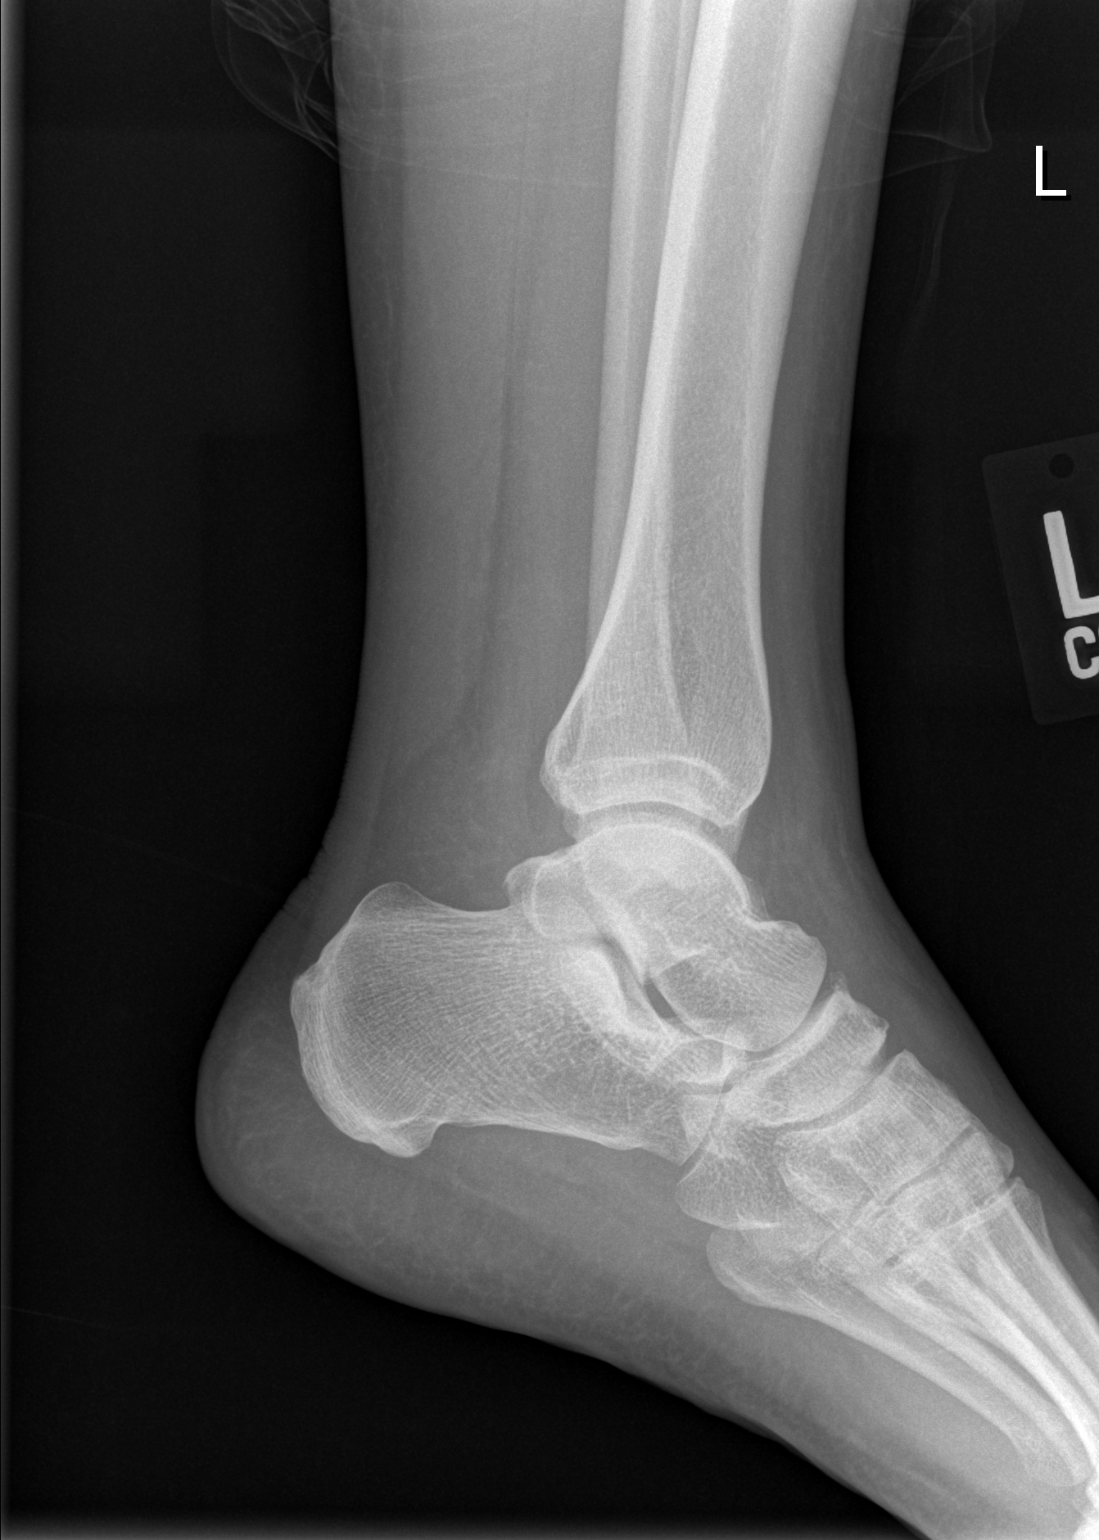

[3 of 3 positions shown; findings below may reference images not displayed]

FINDINGS: No fracture or malalignment. Diffuse soft tissue swelling. Ankle
mortise is symmetric.
IMPRESSION: Soft tissue swelling.  No acute osseous abnormality.

## 2024-05-14 ENCOUNTER — Other Ambulatory Visit: Payer: Self-pay

## 2024-05-14 ENCOUNTER — Encounter (HOSPITAL_BASED_OUTPATIENT_CLINIC_OR_DEPARTMENT_OTHER): Payer: Self-pay | Admitting: Emergency Medicine

## 2024-05-14 ENCOUNTER — Emergency Department (HOSPITAL_BASED_OUTPATIENT_CLINIC_OR_DEPARTMENT_OTHER)
Admission: EM | Admit: 2024-05-14 | Discharge: 2024-05-14 | Disposition: A | Discharge status: Home / Self Care | Attending: Emergency Medicine | Admitting: Emergency Medicine

## 2024-05-14 DIAGNOSIS — L234 Allergic contact dermatitis due to dyes: Secondary | ICD-10-CM | POA: Diagnosis present

## 2024-05-14 DIAGNOSIS — H05223 Edema of bilateral orbit: Secondary | ICD-10-CM | POA: Diagnosis not present

## 2024-05-14 DIAGNOSIS — I10 Essential (primary) hypertension: Secondary | ICD-10-CM | POA: Insufficient documentation

## 2024-05-14 DIAGNOSIS — Z91010 Allergy to peanuts: Secondary | ICD-10-CM | POA: Diagnosis not present

## 2024-05-14 DIAGNOSIS — Z79899 Other long term (current) drug therapy: Secondary | ICD-10-CM | POA: Insufficient documentation

## 2024-05-14 DIAGNOSIS — T7840XA Allergy, unspecified, initial encounter: Secondary | ICD-10-CM

## 2024-05-14 MED ORDER — PREDNISONE 50 MG PO TABS
60.0000 mg | ORAL_TABLET | Freq: Once | ORAL | Status: AC
  Administered 2024-05-14: 60 mg via ORAL
  Filled 2024-05-14: qty 1

## 2024-05-14 MED ORDER — PREDNISONE 20 MG PO TABS
40.0000 mg | ORAL_TABLET | Freq: Every day | ORAL | 0 refills | Status: DC
  Filled 2024-05-14: qty 8, 4d supply, fill #0

## 2024-05-14 MED ORDER — DIPHENHYDRAMINE HCL 25 MG PO CAPS
25.0000 mg | ORAL_CAPSULE | Freq: Once | ORAL | Status: AC
  Administered 2024-05-14: 25 mg via ORAL
  Filled 2024-05-14: qty 1

## 2024-05-14 MED ORDER — PREDNISONE 20 MG PO TABS: 40.0000 mg | ORAL_TABLET | Freq: Every day | ORAL | 0 refills | Status: AC

## 2024-05-14 MED ORDER — FAMOTIDINE 20 MG PO TABS
20.0000 mg | ORAL_TABLET | Freq: Once | ORAL | Status: AC
  Administered 2024-05-14: 20 mg via ORAL

## 2024-05-14 NOTE — Discharge Instructions (Signed)
 It was a pleasure taking part in your care.  As discussed, please begin taking steroids beginning on 7/27.  Take this for 4 days.  Follow-up with your PCP.  Continue taking Benadryl  for itching, 25 mg.  Return to the ED with any new or worsening symptoms.

## 2024-05-14 NOTE — ED Provider Notes (Signed)
 [place] [facility]ERGENCY DE[facility]MENT AT [facility] [place]   CSN: [id number] Arrival date & time: 05/14/24  1231     Patient presents with: Allergic Reaction   Victoria Hicks is a 48 y.o. female with history of hypertension and high cholesterol.  The patient presents to the ED for evaluation of facial swelling.  States that she is allergic to black dye.  Reports she has had a reaction in the past with black dye.  States she has undergone allergy testing.  Reports that yesterday she had her eyebrows stented and they used black dye, she reports she did not think about this.  She states that after having her eyes stented, she began to have bilateral periorbital swelling which is acutely worsened overnight.  She states she took 4.5 mg of Benadryl  but this did not alleviate symptoms.  She is here complaining of bilateral eye itching and swelling.  Denies any pain in her eyes.  Denies any trouble swallowing, nausea, vomiting, hives, fevers, shortness of breath.    Allergic Reaction      Prior to Admission medications   Medication Sig Start Date End Date Taking? Authorizing Provider  predniSONE  (DELTASONE ) 20 MG tablet Take 2 tablets (40 mg total) by mouth daily. 05/14/24  Yes Ruthell Lonni FALCON, PA-C  atenolol (TENORMIN) 50 MG tablet TK 1 T PO D 12/24/18   [provider]  atorvastatin (LIPITOR) 20 MG tablet Take by mouth. 01/31/19   [provider]  cyclobenzaprine (FLEXERIL) 10 MG tablet Take 1 tablet by mouth 3 (three) times daily. 02/21/21   [provider]  gabapentin (NEURONTIN) 600 MG tablet Take by mouth. 03/02/19   [provider]  HYDROcodone -acetaminophen  (NORCO) 7.5-325 MG tablet Take by mouth. 04/16/19   [provider]  Liraglutide -Weight Management (SAXENDA) 18 MG/3ML SOPN Inject into the skin. 03/22/18   [provider]    Allergies: Peanut oil and Almond oil    Review of Systems  HENT:  Positive for facial  swelling.   All other systems reviewed and are negative.   Updated Vital Signs BP 137/85 (BP Location: Right Arm)   Pulse 90   Temp 98.3 F (36.8 C) (Oral)   Resp 16   Ht 5' 1 (1.549 m)   Wt 66.7 kg   SpO2 100%   BMI 27.78 kg/m   Physical Exam Vitals and nursing note reviewed.  Constitutional:      General: She is not in acute distress.    Appearance: She is well-developed.  HENT:     Head: Normocephalic and atraumatic.     Comments: Bilateral periorbital swelling present.  No redness, erythema present to bilateral orbits.  No injected sclera present. Eyes:     Conjunctiva/sclera: Conjunctivae normal.  Cardiovascular:     Rate and Rhythm: Normal rate and regular rhythm.     Heart sounds: No murmur heard. Pulmonary:     Effort: Pulmonary effort is normal. No respiratory distress.     Breath sounds: Normal breath sounds.  Abdominal:     Palpations: Abdomen is soft.     Tenderness: There is no abdominal tenderness.  Musculoskeletal:        General: No swelling.     Cervical back: Neck supple.  Skin:    General: Skin is warm and dry.     Capillary Refill: Capillary refill takes less than 2 seconds.  Neurological:     Mental Status: She is alert.  Psychiatric:  Mood and Affect: Mood normal.     (all labs ordered are listed, but only abnormal results are displayed) Labs Reviewed - No data to display  EKG: None  Radiology: No results found.   Procedures   Medications Ordered in the ED  predniSONE  (DELTASONE ) tablet 60 mg (60 mg Oral Given 05/14/24 1259)  famotidine  (PEPCID ) tablet 20 mg (20 mg Oral Given 05/14/24 1300)  diphenhydrAMINE  (BENADRYL ) capsule 25 mg (25 mg Oral Given 05/14/24 1259)    Medical Decision Making  A 48 year old female presents to the ED for evaluation of bilateral periorbital swelling.  Patient reports allergy to black dye, states that she had black dye injected yesterday to 10 her eyebrows.  Denies any trouble swallowing,  drooling, change phonation.  Only complaining of bilateral periorbital swelling.  She reports that this feels itchy but denies any pain.  There is no erythema, warmth.  There is no injected sclera.  I will concern for perioral cellulitis.  She reports that this began after she came in contact with a known allergen, black dye, so I suspect this most likely allergic reaction.  She was given prednisone , famotidine  and Benadryl  with good effect.  She had reduced swelling.  She will be sent home with a few days of steroids and advised to follow-up with her PCP.  Encouraged to return to the ED with any new or worsening symptoms and she voiced understanding.  Stable to discharge.    Final diagnoses:  Allergic reaction, initial encounter    ED Discharge Orders          Ordered    predniSONE  (DELTASONE ) 20 MG tablet  Daily        05/14/24 1434               Ruthell Lonni FALCON, PA-C 05/14/24 1436    Ruthe Cornet, DO 05/15/24 [phone]

## 2024-05-14 NOTE — ED Triage Notes (Signed)
 Pt had eyebrows tinted yesterday; bil eye swelling noted today; took Benadryl  PTA; is allergic to black dye, but didn't think about it when having the tinting

## 2024-05-14 NOTE — ED Notes (Signed)
 Pt alert and oriented X 4 at the time of discharge. RR even and unlabored. No acute distress noted. Pt verbalized understanding of discharge instructions as discussed. Pt ambulatory to lobby at time of discharge.

## 2024-05-16 ENCOUNTER — Other Ambulatory Visit (HOSPITAL_BASED_OUTPATIENT_CLINIC_OR_DEPARTMENT_OTHER): Payer: Self-pay

## 2024-10-10 ENCOUNTER — Emergency Department (HOSPITAL_BASED_OUTPATIENT_CLINIC_OR_DEPARTMENT_OTHER)
Admission: EM | Admit: 2024-10-10 | Discharge: 2024-10-10 | Disposition: A | Discharge status: Home / Self Care | Attending: Emergency Medicine | Admitting: Emergency Medicine

## 2024-10-10 ENCOUNTER — Emergency Department (HOSPITAL_BASED_OUTPATIENT_CLINIC_OR_DEPARTMENT_OTHER)

## 2024-10-10 ENCOUNTER — Other Ambulatory Visit: Payer: Self-pay

## 2024-10-10 ENCOUNTER — Encounter (HOSPITAL_BASED_OUTPATIENT_CLINIC_OR_DEPARTMENT_OTHER): Payer: Self-pay

## 2024-10-10 DIAGNOSIS — M5432 Sciatica, left side: Secondary | ICD-10-CM | POA: Insufficient documentation

## 2024-10-10 HISTORY — DX: Sciatica, unspecified side: M54.30

## 2024-10-10 LAB — PREGNANCY, URINE: Preg Test, Ur: NEGATIVE

## 2024-10-10 MED ORDER — PREDNISONE 10 MG PO TABS: 20.0000 mg | ORAL_TABLET | Freq: Every day | ORAL | 0 refills | Status: AC

## 2024-10-10 MED ORDER — PREDNISONE 20 MG PO TABS
40.0000 mg | ORAL_TABLET | Freq: Once | ORAL | Status: AC
  Administered 2024-10-10: 40 mg via ORAL
  Filled 2024-10-10: qty 2

## 2024-10-10 MED ORDER — KETOROLAC TROMETHAMINE 15 MG/ML IJ SOLN
15.0000 mg | Freq: Once | INTRAMUSCULAR | Status: AC
  Administered 2024-10-10: 15 mg via INTRAMUSCULAR
  Filled 2024-10-10: qty 1

## 2024-10-10 NOTE — ED Provider Notes (Signed)
 " [place] EMERGENCY DEPARTMENT AT [facility] Provider Note   CSN: [id number] Arrival date & time: 10/10/24  1451     Patient presents with: Leg Pain   Victoria Hicks is a 48 y.o. female.  48 year old female presents ED with complaints of worsening left leg sciatica.  Patient reports being managed by PCP for same being prescribed hydrocodone , Lyrica, muscle relaxers for pain.  She sees her every 3 months for medication management and advised to her last time that she thought the sciatica was getting worse.  Patient reports PCP refilled medication and advised to reevaluate at next visit.  She reports she has had previous surgeries in her lumbar spine.  Patient advises she has not seen her neurologist in a while but is still an established patient.  Patient reports she is still able to ambulate without assistance but when she walks prolonged periods of time the pain makes walking difficult.  Patient denies that she has noticed herself sitting more while doing normal household chores such as folding laundry and washing dishes.  Patient denies any other associated symptoms and reports she has not had any falls or injuries recently.     Prior to Admission medications  Medication Sig Start Date End Date Taking? Authorizing Provider  predniSONE  (DELTASONE ) 10 MG tablet Take 2 tablets (20 mg total) by mouth daily. 10/10/24  Yes Myriam Fonda RAMAN, PA-C  atenolol (TENORMIN) 50 MG tablet TK 1 T PO D 12/24/18   [provider]  atorvastatin (LIPITOR) 20 MG tablet Take by mouth. 01/31/19   [provider]  cyclobenzaprine (FLEXERIL) 10 MG tablet Take 1 tablet by mouth 3 (three) times daily. 02/21/21   [provider]  gabapentin (NEURONTIN) 600 MG tablet Take by mouth. 03/02/19   [provider]  HYDROcodone -acetaminophen  (NORCO) 7.5-325 MG tablet Take by mouth. 04/16/19   [provider]  Liraglutide -Weight Management (SAXENDA) 18 MG/3ML SOPN Inject  into the skin. 03/22/18   [provider]  predniSONE  (DELTASONE ) 20 MG tablet Take 2 tablets (40 mg total) by mouth daily. 05/14/24   Ruthell Lonni FALCON, PA-C    Allergies: Peanut oil and Almond oil    Review of Systems  Musculoskeletal:  Positive for back pain.  All other systems reviewed and are negative.   Updated Vital Signs BP (!) 137/96 (BP Location: Right Arm)   Pulse 88   Temp 97.9 F (36.6 C)   Resp 16   Ht 5' 1 (1.549 m)   Wt 63.5 kg   LMP 08/17/2024   SpO2 100%   BMI 26.45 kg/m   Physical Exam Vitals and nursing note reviewed.  Constitutional:      Appearance: Normal appearance.  HENT:     Head: Normocephalic and atraumatic.     Nose: Nose normal.  Eyes:     Extraocular Movements: Extraocular movements intact.     Conjunctiva/sclera: Conjunctivae normal.     Pupils: Pupils are equal, round, and reactive to light.  Cardiovascular:     Rate and Rhythm: Normal rate.     Heart sounds: Normal heart sounds.  Pulmonary:     Effort: Pulmonary effort is normal. No respiratory distress.     Breath sounds: Normal breath sounds.  Abdominal:     General: Abdomen is flat. There is no distension.     Tenderness: There is no abdominal tenderness.  Musculoskeletal:        General: No swelling or tenderness. Normal range of motion.  Cervical back: Normal range of motion.     Comments: Patient has full range of motion of bilateral lower extremities.  Skin:    General: Skin is warm.     Capillary Refill: Capillary refill takes less than 2 seconds.  Neurological:     General: No focal deficit present.     Mental Status: She is alert.     Comments: Negative Romberg patient has no focal deficits and is able to ambulate without difficulty or assistance.  Positive straight leg raise on left leg.  Patient reports pain is consistent with previous pain and sciatica  Psychiatric:        Mood and Affect: Mood normal.        Behavior: Behavior normal.     (all  labs ordered are listed, but only abnormal results are displayed) Labs Reviewed  PREGNANCY, URINE    EKG: None  Radiology: CT Lumbar Spine Wo Contrast Result Date: 10/10/2024 EXAM: CT OF THE LUMBAR SPINE WITHOUT CONTRAST 10/10/2024 05:41:06 PM TECHNIQUE: CT of the lumbar spine was performed without the administration of intravenous contrast. Multiplanar reformatted images are provided for review. Automated exposure control, iterative reconstruction, and/or weight based adjustment of the mA/kV was utilized to reduce the radiation dose to as low as reasonably achievable. COMPARISON: MRI lumbar spine 07/12/2016. CLINICAL HISTORY: Low back pain, increased fracture risk. FINDINGS: BONES AND ALIGNMENT: Conventional lumbosacral anatomy is assumed with 5 non-rib-bearing, lumbar-type vertebral bodies. Normal vertebral body heights. Unchanged 5 mm retrolisthesis of L5 on S1. No acute fracture or suspicious bone lesion. DEGENERATIVE CHANGES: Moderate lower lumbar facet arthropathy. Moderate disc height loss and degenerative endplate changes at L5 S1. Facet arthropathy contributes to severe bilateral neural foraminal narrowing. SOFT TISSUES: Spinal cord stimulator leads enter the dorsal epidural space at T12 L1. IMPRESSION: 1. No acute fracture or traumatic malalignment of the lumbar spine. 2. At L5-S1, severe bilateral neural foraminal narrowing related to retrolisthesis and facet arthropathy. Electronically signed by: Ryan Chess MD 10/10/2024 05:59 PM EST RP Workstation: [id number]    Procedures   Medications Ordered in the ED  ketorolac  (TORADOL ) 15 MG/ML injection 15 mg (15 mg Intramuscular Given 10/10/24 1825)  predniSONE  (DELTASONE ) tablet 40 mg (40 mg Oral Given 10/10/24 1824)    48 y.o. female presents to the ED with complaints of worsening sciatic pain in her left leg and left buttocks the differential diagnosis includes compression fracture, spinal stenosis, cauda equina, sciatica pain,  muscular injury.  (Ddx)  On arrival pt is nontoxic, vitals unremarkable. Exam significant for positive left straight leg raise.  Patient is ambulatory in ED without difficulty or assistance.  Lower ilaterally with good range of motion, cap refill, and strong pulses.  I ordered medication Toradol  and steroids for sciatica pain.   Imaging Studies ordered:  I ordered imaging studies which included CT lumbar spine which showed no acute fracture or malignancy to the lumbar spine.  There was severe bilateral neural for minimal narrowing and L5-S1 related to retrolisthesis.   ED Course:   Patient sitting comfortably in ED bed in no acute distress nontoxic-appearing.  Patient is already being treated with Lyrica, hydrocodone , and muscle relaxers by PCP for sciatic pain.  Patient reports recently she has increased her activities which she believes is the cause of the worsening pain.  Patient has not had any incontinence issues, numbness, loss of function, or injury.  Patient advises pain is similar to her sciatic pain in her past.  CT lumbar spine will be ordered  for evaluation of compression fracture because patient reports history of same.  CT showed no acute fracture or malignancy.  Patient was advised to follow-up with PCP after course of steroids and to follow-up at her established neurology for further evaluation and treatment of worsening sciatic pain.  Patient was given strict return precautions and agreed with treatment plan.  Patient is comfortable with discharge at this time.   Portions of this note were generated with Scientist, clinical (histocompatibility and immunogenetics). Dictation errors may occur despite best attempts at proofreading.   Final diagnoses:  Sciatica of left side    ED Discharge Orders          Ordered    predniSONE  (DELTASONE ) 10 MG tablet  Daily        10/10/24 1818             "

## 2024-10-10 NOTE — ED Triage Notes (Signed)
 Pt arrives with complaints of left leg pain. States that she has sciatica and over the last few days it has gotten worse. States that pain is causing her to have pain shoot down her left foot and making her feel off balance.

## 2024-10-10 NOTE — Discharge Instructions (Signed)
 Symptoms are consistent with sciatica pain.  You have been started on Toradol  and prednisone  in the ED today.  Please take the prednisone  for 5 days as prescribed.  Please take it with food in the morning.  It is advised to follow-up with your primary care provider for reevaluation and follow-up of symptoms after steroid course.  Please also follow-up with your already established neurologist as soon as possible for reevaluation.  If you experience any concerning new or worsening symptoms please return the ED for further evaluation.
